# Patient Record
Sex: Male | Born: 1940 | Race: White | Hispanic: No | Marital: Single | Smoking: Current every day smoker
Health system: Southern US, Community
[De-identification: ages and names within clinical notes are randomized; demographics above are authoritative.]

## PROBLEM LIST (undated history)

## (undated) DIAGNOSIS — J449 Chronic obstructive pulmonary disease, unspecified: Secondary | ICD-10-CM

## (undated) DIAGNOSIS — I252 Old myocardial infarction: Secondary | ICD-10-CM

## (undated) HISTORY — PX: LUNG SURGERY: SHX703

---

## 2012-09-13 ENCOUNTER — Emergency Department (INDEPENDENT_AMBULATORY_CARE_PROVIDER_SITE_OTHER): Payer: Medicare Other

## 2012-09-13 ENCOUNTER — Emergency Department (INDEPENDENT_AMBULATORY_CARE_PROVIDER_SITE_OTHER)
Admission: EM | Admit: 2012-09-13 | Discharge: 2012-09-13 | Disposition: A | Discharge status: Home / Self Care | Payer: Medicare Other | Source: Home / Self Care | Attending: Family Medicine | Admitting: Family Medicine

## 2012-09-13 ENCOUNTER — Encounter: Payer: Self-pay | Admitting: *Deleted

## 2012-09-13 DIAGNOSIS — S99929A Unspecified injury of unspecified foot, initial encounter: Secondary | ICD-10-CM

## 2012-09-13 DIAGNOSIS — Z0389 Encounter for observation for other suspected diseases and conditions ruled out: Secondary | ICD-10-CM

## 2012-09-13 DIAGNOSIS — S8990XA Unspecified injury of unspecified lower leg, initial encounter: Secondary | ICD-10-CM

## 2012-09-13 DIAGNOSIS — W268XXA Contact with other sharp object(s), not elsewhere classified, initial encounter: Secondary | ICD-10-CM

## 2012-09-13 HISTORY — DX: Chronic obstructive pulmonary disease, unspecified: J44.9

## 2012-09-13 HISTORY — DX: Old myocardial infarction: I25.2

## 2012-09-13 MED ORDER — CEPHALEXIN 500 MG PO CAPS: 500.0000 mg | ORAL_CAPSULE | Freq: Two times a day (BID) | ORAL | Status: AC

## 2012-09-13 NOTE — ED Notes (Signed)
Patient reports breaking a lamp 3 nights ago and stepping on a piece. He believes he removed all of the glass or plastic but still has pain in sole of left heel. Site is minimally draining. No induration, redness, chills, fever, etc.

## 2012-09-13 NOTE — ED Notes (Signed)
Bacitracin applied to site and bandaged.

## 2012-09-13 NOTE — ED Provider Notes (Signed)
History     CSN: [id number]  Arrival date & time 09/13/12  1044   First MD Initiated Contact with Patient 09/13/12 1229      Chief Complaint  Patient presents with  . Foreign Body in Skin    ?possible object in sole of left foot      HPI Comments: Patient reports breaking a lamp 3 nights ago and stepping on a piece with his left foot, puncturing his heel. He believes he removed all of the glass or plastic but still has pain in the plantar surface of left heel, and wonders if there may be a retained fragment.  He has noted a small amount of drainage from the puncture site.  He states that his last Tdap was about 4 to 5 years ago.  The history is provided by the patient.    Past Medical History  Diagnosis Date  . History of heart attack     x6  . COPD (chronic obstructive pulmonary disease)     Past Surgical History  Procedure Date  . Lung surgery     History reviewed. No pertinent family history.  History  Substance Use Topics  . Smoking status: Current Every Day Smoker -- 0.5 packs/day for 60 years    Types: Cigarettes  . Smokeless tobacco: Current User  . Alcohol Use: No      Review of Systems  Constitutional: Negative.   Musculoskeletal:       Pain in left heel  All other systems reviewed and are negative.    Allergies  Review of patient's allergies indicates no known allergies.  Home Medications   Current Outpatient Rx  Name  Route  Sig  Dispense  Refill  . ALBUTEROL SULFATE 2 MG PO TABS   Oral   Take 2 mg by mouth 3 (three) times daily.         Maximino Greenland 18-103 MCG/ACT IN AERO   Inhalation   Inhale 2 puffs into the lungs every 6 (six) hours as needed.         . ASPIRIN 325 MG PO TABS   Oral   Take 325 mg by mouth daily.         . BUDESONIDE-FORMOTEROL FUMARATE 160-4.5 MCG/ACT IN AERO   Inhalation   Inhale 2 puffs into the lungs 2 (two) times daily.         . IPRATROPIUM-ALBUTEROL 0.5-2.5 (3) MG/3ML IN SOLN  Nebulization   Take 3 mLs by nebulization.         Marland Kitchen LISINOPRIL 10 MG PO TABS   Oral   Take 10 mg by mouth daily.         Marland Kitchen OMEPRAZOLE 20 MG PO CPDR   Oral   Take 20 mg by mouth daily.         Marland Kitchen TIOTROPIUM BROMIDE MONOHYDRATE 18 MCG IN CAPS   Inhalation   Place 18 mcg into inhaler and inhale daily.         Marland Kitchen VITAMIN E 400 UNITS PO CAPS   Oral   Take 400 Units by mouth daily.         . CEPHALEXIN 500 MG PO CAPS   Oral   Take 1 capsule (500 mg total) by mouth 2 (two) times daily.   14 capsule   0     BP 107/65  Pulse 80  Temp 97.5 F (36.4 C) (Oral)  Resp 16  SpO2 98%  Physical Exam  Nursing note and vitals reviewed.  Constitutional: He is oriented to person, place, and time. He appears well-developed and well-nourished. No distress.  Eyes: Pupils are equal, round, and reactive to light.  Musculoskeletal:       Feet:       On the left heel, plantar surface is a small linear puncture wound about 5mm long.  There is a minimal amount of serous discharge.  No surrounding erythema or swelling and minimal tenderness.  No evidence of foreign body   Neurological: He is alert and oriented to person, place, and time.  Skin: Skin is warm and dry. No erythema.    ED Course  Procedures none   Labs Reviewed: *RADIOLOGY REPORT*  Clinical Data: Foreign body. Stepped on glass.  LEFT OS CALCIS - 2+ VIEW  Comparison: None.  Findings: Calcaneus appears within normal limits. Small vessel atherosclerosis is present. The initial view of the calcaneus was over penetrated and this was repeated with marker over the area of abnormality. There is no radiopaque foreign body identified in the soft tissues. 2 mm Linear density over the dorsal aspect of the calcaneal tuberosity on the lateral view is favored to represent either artifact or debris external to the patient. Similar tiny opacity is present over the plantar aspect of the hind foot.  IMPRESSION: No radiopaque  foreign body identified.        1. Injury of heel.  ?early cellulitis.  No evidence of foreign body.       MDM   Begin Keflex for one week.  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  Change bandage daily and apply antibiotic ointment.  May soak in warm water once or twice daily. Followup with Family Doctor if not improved in one week.         Lattie Haw, MD 09/17/12 [phone]

## 2012-09-13 NOTE — Discharge Instructions (Signed)
Keep wound clean and dry.  Change bandage daily and apply antibiotic ointment.  May soak in warm water once or twice daily.

## 2013-12-28 IMAGING — CR DG OS CALCIS 2+V*L*
3 series · 3 of 3 positions shown · non-contrast
Comparison: None.

CLINICAL DATA: Foreign body.  Stepped on glass.

LEFT OS CALCIS - 2+ VIEW

[view not recorded (1 of 3)]
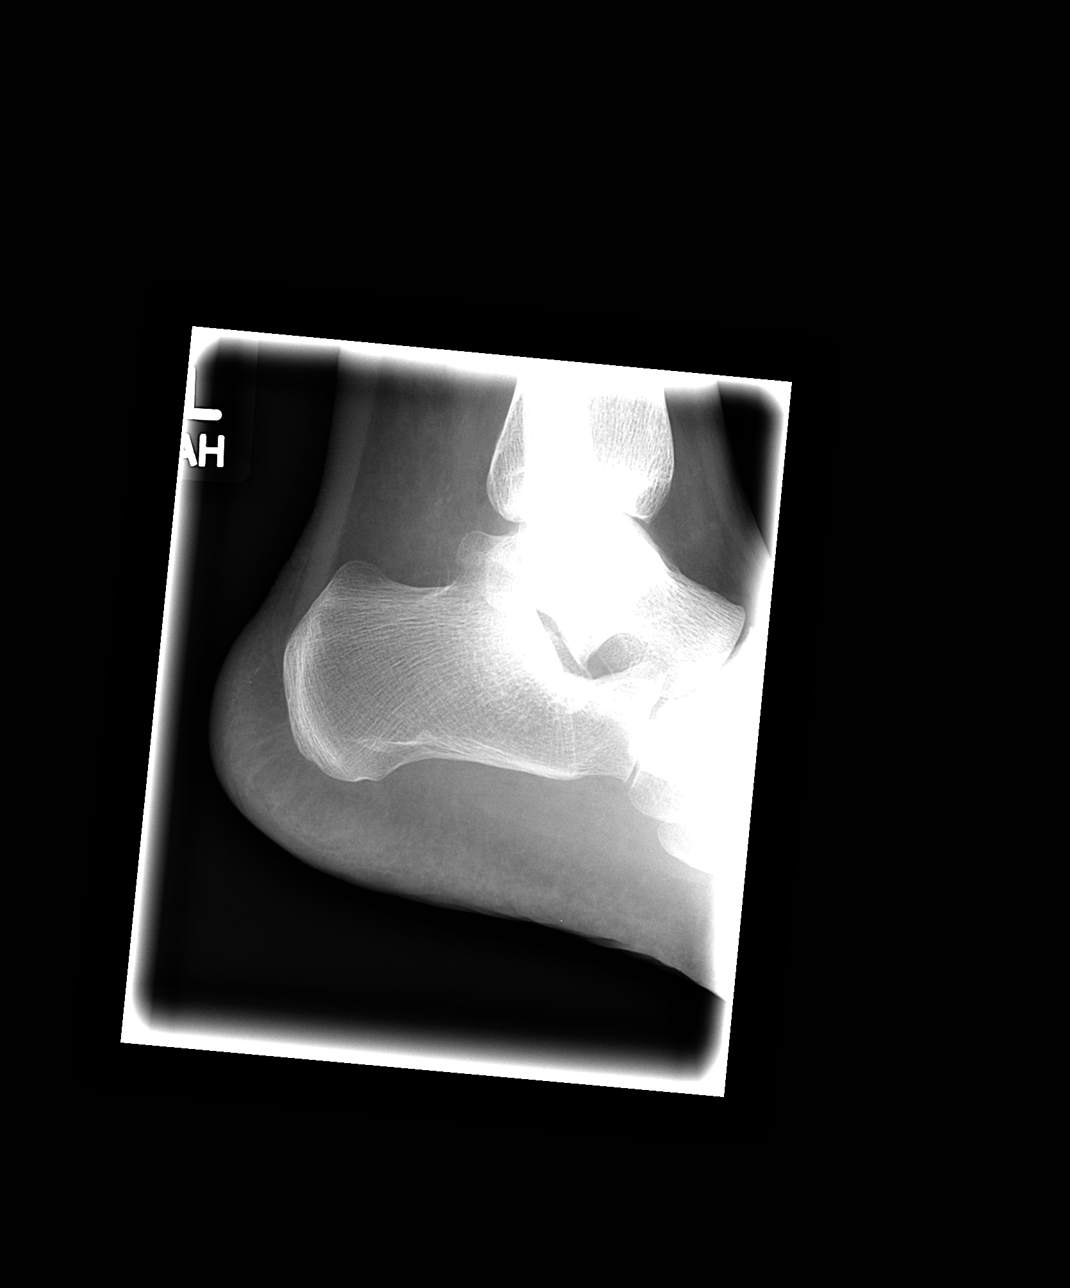

[view not recorded (2 of 3)]
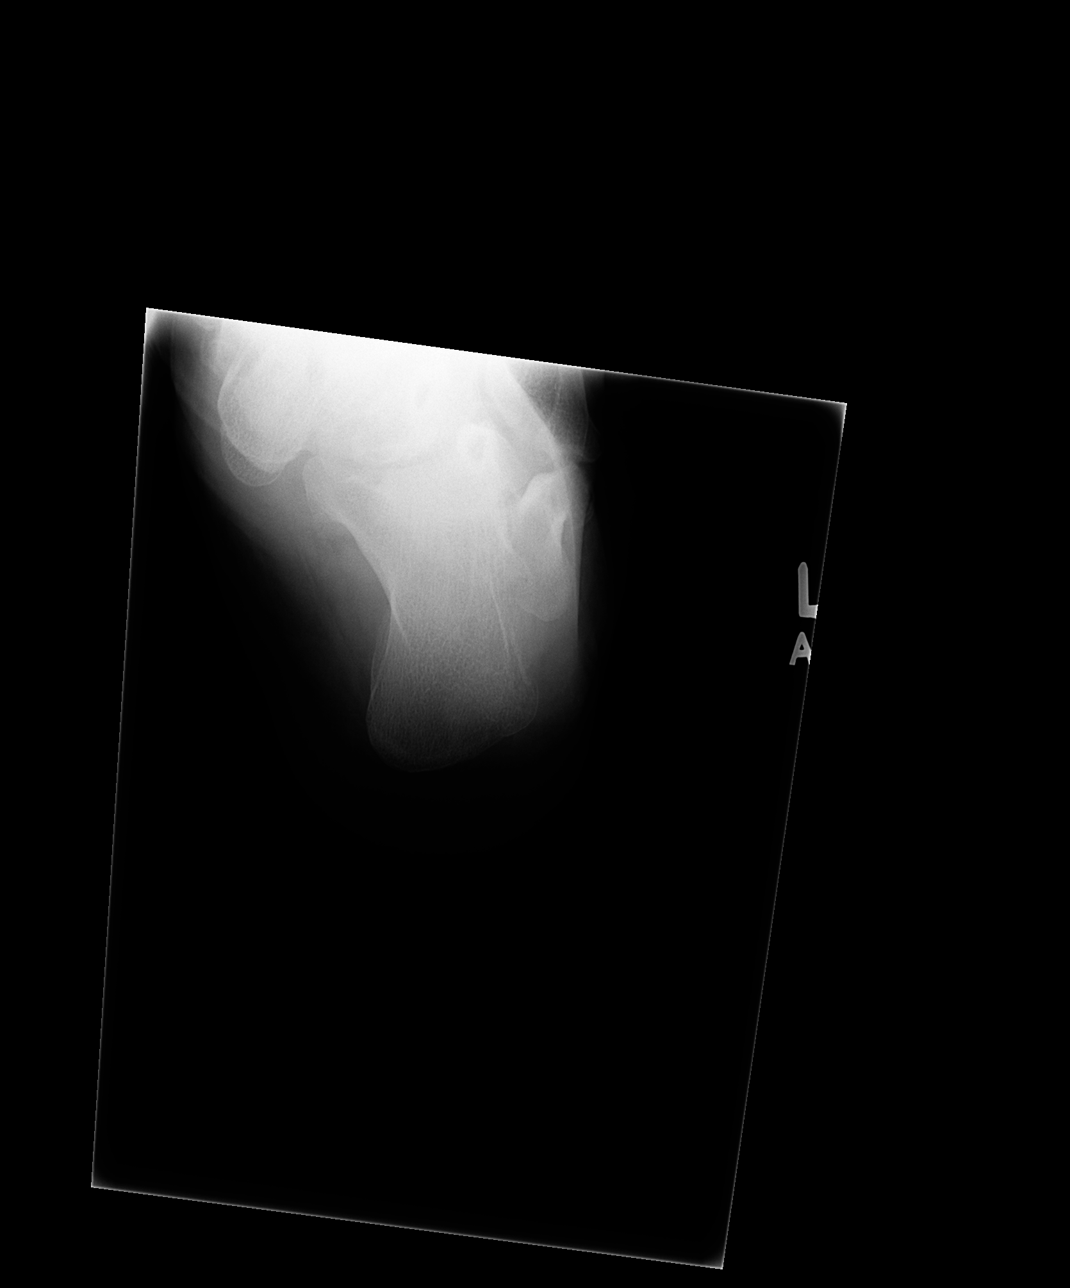

[view not recorded (3 of 3)]
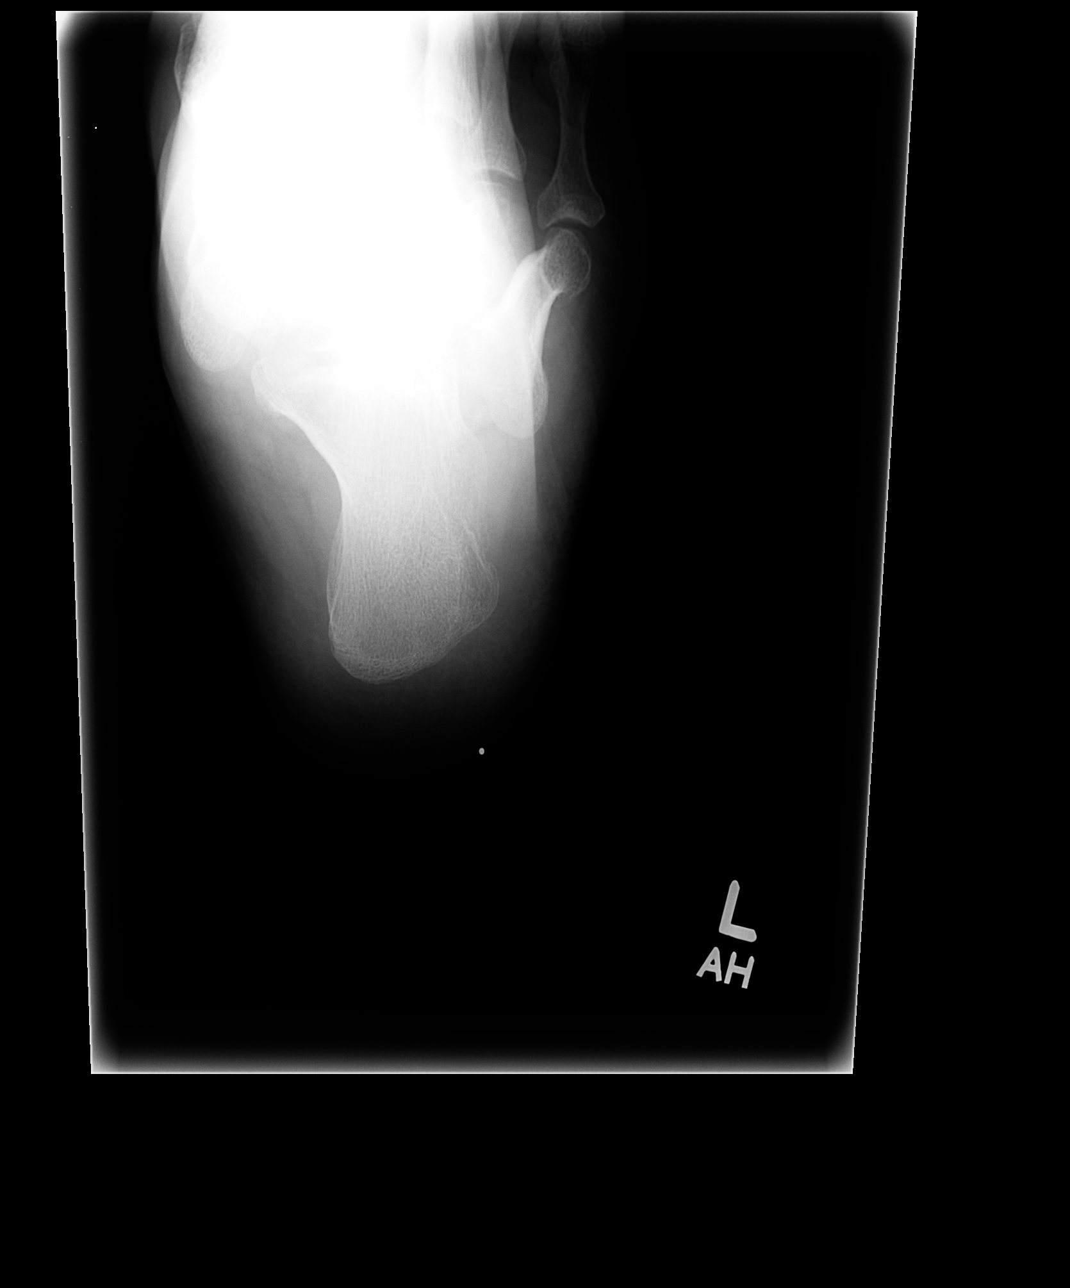

[3 of 3 positions shown; findings below may reference images not displayed]

FINDINGS: Calcaneus appears within normal limits.  Small vessel
atherosclerosis is present. The initial view of the calcaneus was
over penetrated and this was repeated with marker over the area of
abnormality.  There is no radiopaque foreign body identified in the
soft tissues. 2 mm Linear density over the dorsal aspect of the
calcaneal tuberosity on the lateral view is favored to represent
either artifact or debris external to the patient.  Similar tiny
opacity is present over the plantar aspect of the hind foot.
IMPRESSION: No radiopaque foreign body identified.

## 2021-06-01 ENCOUNTER — Emergency Department (INDEPENDENT_AMBULATORY_CARE_PROVIDER_SITE_OTHER)
Admission: EM | Admit: 2021-06-01 | Discharge: 2021-06-01 | Disposition: A | Discharge status: Home / Self Care | Payer: Medicare Other | Source: Home / Self Care

## 2021-06-01 ENCOUNTER — Encounter: Payer: Self-pay | Admitting: Emergency Medicine

## 2021-06-01 DIAGNOSIS — K625 Hemorrhage of anus and rectum: Secondary | ICD-10-CM

## 2021-06-01 NOTE — ED Triage Notes (Addendum)
Pt here with his son - pt has had 5  bowel movements w/ bright red blood since 5am this am  Pt here w/ his son  Pt has increased fatigue  Pt is on Eliquis Lost 5 lbs between 0300 & 0830

## 2021-06-01 NOTE — Discharge Instructions (Addendum)
Advised patient/son go to [facility] now for immediate evaluation of rectal bleeding.

## 2021-06-01 NOTE — ED Notes (Signed)
Patient is being discharged from the Urgent Care and sent to the Emergency Department via POV w/ son. Per M.Ragan, patient is in need of higher level of care due to rectal bleeding, wt loss & blood thinner. Patient is aware and verbalizes understanding of plan of care.  Vitals:   06/01/21 0918  BP: 107/70  Pulse: 95  Resp: 18  Temp: 97.7 F (36.5 C)  SpO2: 98%   Report called to charge RN at [facility] at [phone]

## 2021-06-01 NOTE — ED Provider Notes (Signed)
[facility]    CSN: [id number] Arrival date & time: 06/01/21  0906      History   Chief Complaint Chief Complaint  Patient presents with   Rectal Bleeding    HPI Demichael Traum is a 80 y.o. male.   HPI 80 year old male presents with rectal bleeding for 1 to 2 days and is accompanied by his son this morning.  PMH significant for MI x 6 and is currently on Apixaban.  Patient reports 5 bouts of rectal bleeding this morning and losing 5 pounds.  Patient reports fatigue weakness and is pale.  Past Medical History:  Diagnosis Date   COPD (chronic obstructive pulmonary disease) (HCC)    History of heart attack    x6    There are no problems to display for this patient.   Past Surgical History:  Procedure Laterality Date   LUNG SURGERY         Home Medications    Prior to Admission medications   Medication Sig Start Date End Date Taking? Authorizing Provider  albuterol (PROVENTIL) 2 MG tablet Take 2 mg by mouth 3 (three) times daily.    [provider]  albuterol-ipratropium (COMBIVENT) 18-103 MCG/ACT inhaler Inhale 2 puffs into the lungs every 6 (six) hours as needed.    [provider]  aspirin 325 MG tablet Take 325 mg by mouth daily.    [provider]  budesonide-formoterol (SYMBICORT) 160-4.5 MCG/ACT inhaler Inhale 2 puffs into the lungs 2 (two) times daily.    [provider]  cephALEXin (KEFLEX) 500 MG capsule Take 1 capsule (500 mg total) by mouth 2 (two) times daily. 09/13/12   Lattie Haw, MD  ipratropium-albuterol (DUONEB) 0.5-2.5 (3) MG/3ML SOLN Take 3 mLs by nebulization.    [provider]  lisinopril (PRINIVIL,ZESTRIL) 10 MG tablet Take 10 mg by mouth daily.    [provider]  omeprazole (PRILOSEC) 20 MG capsule Take 20 mg by mouth daily.    [provider]  tiotropium (SPIRIVA) 18 MCG inhalation capsule Place 18 mcg into inhaler and inhale daily.    [provider]   vitamin E 400 UNIT capsule Take 400 Units by mouth daily.    [provider]    Family History No family history on file.  Social History Social History   Tobacco Use   Smoking status: Every Day    Packs/day: 0.50    Years: 60.00    Pack years: 30.00    Types: Cigarettes   Smokeless tobacco: Current  Substance Use Topics   Alcohol use: No   Drug use: No     Allergies   Diltiazem, Aspirin, and Pravastatin   Review of Systems Review of Systems   Physical Exam Triage Vital Signs ED Triage Vitals  Enc Vitals Group     BP      Pulse      Resp      Temp      Temp src      SpO2      Weight      Height      Head Circumference      Peak Flow      Pain Score      Pain Loc      Pain Edu?      Excl. in GC?    No data found.  Updated Vital Signs BP 107/70 (BP Location: Left Arm)   Pulse 95   Temp 97.7 F (36.5 C) (  Oral)   Resp 18   Wt 135 lb (61.2 kg) Comment: lost 5 lbs since 3am  SpO2 98%      Physical Exam Vitals and nursing note reviewed.  Constitutional:      General: He is not in acute distress.    Appearance: Normal appearance. He is ill-appearing.  HENT:     Head: Normocephalic and atraumatic.     Mouth/Throat:     Mouth: Mucous membranes are moist.     Pharynx: Oropharynx is clear.  Eyes:     Extraocular Movements: Extraocular movements intact.     Conjunctiva/sclera: Conjunctivae normal.     Pupils: Pupils are equal, round, and reactive to light.  Cardiovascular:     Rate and Rhythm: Normal rate and regular rhythm.     Pulses: Normal pulses.     Heart sounds: Normal heart sounds.  Pulmonary:     Effort: Pulmonary effort is normal.     Breath sounds: No wheezing, rhonchi or rales.     Comments: Diminished breath sounds noted throughout Musculoskeletal:        General: Normal range of motion.     Cervical back: Normal range of motion and neck supple.  Skin:    General: Skin is warm and dry.  Neurological:     General: No  focal deficit present.     Mental Status: He is alert and oriented to person, place, and time. Mental status is at baseline.  Psychiatric:        Mood and Affect: Mood normal.        Behavior: Behavior normal.        Thought Content: Thought content normal.     UC Treatments / Results  Labs (all labs ordered are listed, but only abnormal results are displayed) Labs Reviewed - No data to display  EKG   Radiology No results found.  Procedures Procedures (including critical care time)  Medications Ordered in UC Medications - No data to display  Initial Impression / Assessment and Plan / UC Course  I have reviewed the triage vital signs and the nursing notes.  Pertinent labs & imaging results that were available during my care of the patient were reviewed by me and considered in my medical decision making (see chart for details).     MDM: 1.  Rectal bleeding- advised patient/son go to [facility] now for immediate evaluation of rectal bleeding.  Patient/son agreed and verbalized understanding of these instructions and this plan of care this morning.  Patient discharged, hemodynamically stable. Final Clinical Impressions(s) / UC Diagnoses   Final diagnoses:  Rectal bleeding     Discharge Instructions      Advised patient/son go to [facility] now for immediate evaluation of rectal bleeding.     ED Prescriptions   None    PDMP not reviewed this encounter.   Trevor Iha, FNP 06/01/21 [phone]
